# Patient Record
Sex: Female | Born: 1950 | Race: White | Hispanic: No | Smoking: Never smoker
Health system: Southern US, Community
[De-identification: ages and names within clinical notes are randomized; demographics above are authoritative.]

## PROBLEM LIST (undated history)

## (undated) DIAGNOSIS — I10 Essential (primary) hypertension: Secondary | ICD-10-CM

## (undated) DIAGNOSIS — C801 Malignant (primary) neoplasm, unspecified: Secondary | ICD-10-CM

## (undated) HISTORY — PX: WRIST SURGERY: SHX841

## (undated) HISTORY — PX: ABDOMINAL HYSTERECTOMY: SHX81

---

## 1997-12-28 ENCOUNTER — Other Ambulatory Visit: Admission: RE | Admit: 1997-12-28 | Discharge: 1997-12-28 | Payer: Self-pay | Admitting: Obstetrics and Gynecology

## 1999-01-25 ENCOUNTER — Other Ambulatory Visit: Admission: RE | Admit: 1999-01-25 | Discharge: 1999-01-25 | Payer: Self-pay | Admitting: Obstetrics and Gynecology

## 1999-12-28 ENCOUNTER — Encounter: Payer: Self-pay | Admitting: Emergency Medicine

## 1999-12-28 ENCOUNTER — Emergency Department (HOSPITAL_COMMUNITY): Admission: EM | Admit: 1999-12-28 | Discharge: 1999-12-28 | Payer: Self-pay | Admitting: Emergency Medicine

## 2000-01-10 ENCOUNTER — Encounter (INDEPENDENT_AMBULATORY_CARE_PROVIDER_SITE_OTHER): Payer: Self-pay

## 2000-01-10 ENCOUNTER — Other Ambulatory Visit: Admission: RE | Admit: 2000-01-10 | Discharge: 2000-01-10 | Payer: Self-pay | Admitting: Gastroenterology

## 2000-02-15 ENCOUNTER — Other Ambulatory Visit: Admission: RE | Admit: 2000-02-15 | Discharge: 2000-02-15 | Payer: Self-pay | Admitting: Obstetrics and Gynecology

## 2006-06-28 ENCOUNTER — Emergency Department (HOSPITAL_COMMUNITY): Admission: EM | Admit: 2006-06-28 | Discharge: 2006-06-29 | Payer: Self-pay | Admitting: Emergency Medicine

## 2008-07-17 ENCOUNTER — Emergency Department (HOSPITAL_COMMUNITY): Admission: EM | Admit: 2008-07-17 | Discharge: 2008-07-17 | Payer: Self-pay | Admitting: Emergency Medicine

## 2010-09-07 ENCOUNTER — Emergency Department (HOSPITAL_COMMUNITY): Payer: Non-veteran care

## 2010-09-07 ENCOUNTER — Emergency Department (HOSPITAL_COMMUNITY)
Admission: EM | Admit: 2010-09-07 | Discharge: 2010-09-08 | Disposition: A | Discharge status: Home / Self Care | Payer: Non-veteran care | Attending: Emergency Medicine | Admitting: Emergency Medicine

## 2010-09-07 DIAGNOSIS — R197 Diarrhea, unspecified: Secondary | ICD-10-CM | POA: Insufficient documentation

## 2010-09-07 DIAGNOSIS — E876 Hypokalemia: Secondary | ICD-10-CM | POA: Insufficient documentation

## 2010-09-07 DIAGNOSIS — R112 Nausea with vomiting, unspecified: Secondary | ICD-10-CM | POA: Insufficient documentation

## 2010-09-07 DIAGNOSIS — R109 Unspecified abdominal pain: Secondary | ICD-10-CM | POA: Insufficient documentation

## 2010-09-07 LAB — COMPREHENSIVE METABOLIC PANEL
ALT: 54 U/L — ABNORMAL HIGH (ref 0–35)
AST: 34 U/L (ref 0–37)
Alkaline Phosphatase: 133 U/L — ABNORMAL HIGH (ref 39–117)
CO2: 25 mEq/L (ref 19–32)
GFR calc non Af Amer: 47 mL/min — ABNORMAL LOW (ref >60)
Glucose, Bld: 104 mg/dL — ABNORMAL HIGH (ref 70–99)
Potassium: 2.3 mEq/L — CL (ref 3.5–5.1)
Sodium: 136 mEq/L (ref 135–145)

## 2010-09-07 LAB — CBC
HCT: 38.4 % (ref 36.0–46.0)
MCH: 29.1 pg (ref 26.0–34.0)
MCHC: 35.4 g/dL (ref 30.0–36.0)
MCV: 82.2 fL (ref 78.0–100.0)
RDW: 13.7 % (ref 11.5–15.5)

## 2010-09-07 LAB — DIFFERENTIAL
Basophils Absolute: 0 10*3/uL (ref 0.0–0.1)
Eosinophils Relative: 4 % (ref 0–5)
Lymphocytes Relative: 18 % (ref 12–46)
Monocytes Absolute: 0.9 10*3/uL (ref 0.1–1.0)

## 2010-09-07 LAB — LIPASE, BLOOD: Lipase: 14 U/L (ref 11–59)

## 2010-09-07 LAB — URINALYSIS, ROUTINE W REFLEX MICROSCOPIC
Bilirubin Urine: NEGATIVE
Hgb urine dipstick: NEGATIVE
Ketones, ur: NEGATIVE mg/dL
Protein, ur: NEGATIVE mg/dL
Urobilinogen, UA: 0.2 mg/dL (ref 0.0–1.0)

## 2010-09-08 LAB — BASIC METABOLIC PANEL
BUN: 20 mg/dL (ref 6–23)
CO2: 27 mEq/L (ref 19–32)
Calcium: 9.2 mg/dL (ref 8.4–10.5)
GFR calc non Af Amer: >60 mL/min (ref >60)
Glucose, Bld: 97 mg/dL (ref 70–99)
Potassium: 2.7 mEq/L — CL (ref 3.5–5.1)

## 2014-05-14 ENCOUNTER — Emergency Department (HOSPITAL_COMMUNITY)
Admission: EM | Admit: 2014-05-14 | Discharge: 2014-05-14 | Disposition: A | Discharge status: Home / Self Care | Payer: Non-veteran care | Attending: Emergency Medicine | Admitting: Emergency Medicine

## 2014-05-14 ENCOUNTER — Encounter (HOSPITAL_COMMUNITY): Payer: Self-pay | Admitting: Emergency Medicine

## 2014-05-14 ENCOUNTER — Emergency Department (HOSPITAL_COMMUNITY): Payer: Non-veteran care

## 2014-05-14 DIAGNOSIS — I10 Essential (primary) hypertension: Secondary | ICD-10-CM | POA: Insufficient documentation

## 2014-05-14 DIAGNOSIS — R110 Nausea: Secondary | ICD-10-CM | POA: Diagnosis not present

## 2014-05-14 DIAGNOSIS — R079 Chest pain, unspecified: Secondary | ICD-10-CM | POA: Diagnosis present

## 2014-05-14 DIAGNOSIS — Z79899 Other long term (current) drug therapy: Secondary | ICD-10-CM | POA: Insufficient documentation

## 2014-05-14 HISTORY — DX: Essential (primary) hypertension: I10

## 2014-05-14 LAB — BASIC METABOLIC PANEL
Anion gap: 6 (ref 5–15)
BUN: 18 mg/dL (ref 6–23)
CALCIUM: 10 mg/dL (ref 8.4–10.5)
CHLORIDE: 107 meq/L (ref 96–112)
CO2: 30 mmol/L (ref 19–32)
Creatinine, Ser: 0.99 mg/dL (ref 0.50–1.10)
GFR calc non Af Amer: 59 mL/min — ABNORMAL LOW (ref >90)
GFR, EST AFRICAN AMERICAN: 68 mL/min — AB (ref >90)
Glucose, Bld: 90 mg/dL (ref 70–99)
POTASSIUM: 3.2 mmol/L — AB (ref 3.5–5.1)
Sodium: 143 mmol/L (ref 135–145)

## 2014-05-14 LAB — CBC WITH DIFFERENTIAL/PLATELET
Basophils Absolute: 0 10*3/uL (ref 0.0–0.1)
Basophils Relative: 0 % (ref 0–1)
EOS ABS: 0.2 10*3/uL (ref 0.0–0.7)
EOS PCT: 2 % (ref 0–5)
HCT: 39.2 % (ref 36.0–46.0)
Hemoglobin: 13.6 g/dL (ref 12.0–15.0)
LYMPHS ABS: 2.4 10*3/uL (ref 0.7–4.0)
LYMPHS PCT: 26 % (ref 12–46)
MCH: 28.4 pg (ref 26.0–34.0)
MCHC: 34.7 g/dL (ref 30.0–36.0)
MCV: 81.8 fL (ref 78.0–100.0)
MONO ABS: 0.6 10*3/uL (ref 0.1–1.0)
Monocytes Relative: 7 % (ref 3–12)
Neutro Abs: 6 10*3/uL (ref 1.7–7.7)
Neutrophils Relative %: 65 % (ref 43–77)
PLATELETS: 260 10*3/uL (ref 150–400)
RBC: 4.79 MIL/uL (ref 3.87–5.11)
RDW: 14 % (ref 11.5–15.5)
WBC: 9.2 10*3/uL (ref 4.0–10.5)

## 2014-05-14 LAB — D-DIMER, QUANTITATIVE (NOT AT ARMC): D DIMER QUANT: 0.31 ug{FEU}/mL (ref 0.00–0.48)

## 2014-05-14 LAB — I-STAT TROPONIN, ED
TROPONIN I, POC: 0 ng/mL (ref 0.00–0.08)
TROPONIN I, POC: 0.01 ng/mL (ref 0.00–0.08)

## 2014-05-14 NOTE — ED Provider Notes (Signed)
CSN: [id number]     Arrival date & time 05/14/14  1238 History   First MD Initiated Contact with Patient 05/14/14 1636     Chief Complaint  Patient presents with  . Chest Pain     (Consider location/radiation/quality/duration/timing/severity/associated sxs/prior Treatment) Patient is a 64 y.o. female presenting with chest pain.  Chest Pain Pain location:  Substernal area Pain quality: pressure   Pain radiates to:  Does not radiate Pain radiates to the back: no   Pain severity:  Moderate Onset quality:  Gradual Duration:  1 day Timing:  Intermittent Progression:  Waxing and waning Chronicity:  New Context comment:  Changed BP medications to AM instead of PM Relieved by:  Nothing Worsened by:  Nothing tried Associated symptoms: nausea   Associated symptoms: no abdominal pain, no cough, no diaphoresis, no fever, no shortness of breath, no syncope and not vomiting     Past Medical History  Diagnosis Date  . Hypertension    History reviewed. No pertinent past surgical history. History reviewed. No pertinent family history. History  Substance Use Topics  . Smoking status: Never Smoker   . Smokeless tobacco: Not on file  . Alcohol Use: No   OB History    No data available     Review of Systems  Constitutional: Negative for fever and diaphoresis.  Respiratory: Negative for cough and shortness of breath.   Cardiovascular: Positive for chest pain. Negative for syncope.  Gastrointestinal: Positive for nausea. Negative for vomiting and abdominal pain.  All other systems reviewed and are negative.     Allergies  Review of patient's allergies indicates no known allergies.  Home Medications   Prior to Admission medications   Medication Sig Start Date End Date Taking? Authorizing Provider  amLODipine (NORVASC) 5 MG tablet Take 5 mg by mouth daily.   Yes Historical Provider, MD  calcium-vitamin D (OSCAL WITH D) 500-200 MG-UNIT per tablet Take 1 tablet by mouth daily with  breakfast.   Yes Historical Provider, MD  Cyanocobalamin (VITAMIN B 12 PO) Take 1,000 mcg by mouth daily.   Yes Historical Provider, MD  Magnesium Oxide (MAG-OX 400 PO) Take 400 mg by mouth daily.   Yes Historical Provider, MD  Multiple Vitamins-Minerals (MULTIVITAMIN WITH MINERALS) tablet Take 1 tablet by mouth daily.   Yes Historical Provider, MD  omeprazole (PRILOSEC) 20 MG capsule Take 20 mg by mouth 2 (two) times daily before a meal.   Yes Historical Provider, MD  triamterene-hydrochlorothiazide (MAXZIDE-25) 37.5-25 MG per tablet Take 0.5 tablets by mouth daily.   Yes Historical Provider, MD  venlafaxine (EFFEXOR) 75 MG tablet Take 75 mg by mouth daily.   Yes Historical Provider, MD  Vitamin D, Cholecalciferol, 1000 UNITS TABS Take 2,000 Units by mouth 2 (two) times daily.   Yes Historical Provider, MD   BP 172/58 mmHg  Pulse 60  Temp(Src) 98.3 F (36.8 C) (Oral)  Resp 14  SpO2 100% Physical Exam  Constitutional: She is oriented to person, place, and time. She appears well-developed and well-nourished.  HENT:  Head: Normocephalic and atraumatic.  Right Ear: External ear normal.  Left Ear: External ear normal.  Eyes: Conjunctivae and EOM are normal. Pupils are equal, round, and reactive to light.  Neck: Normal range of motion. Neck supple.  Cardiovascular: Normal rate, regular rhythm, normal heart sounds and intact distal pulses.   Pulmonary/Chest: Effort normal and breath sounds normal.  Abdominal: Soft. Bowel sounds are normal. There is no tenderness.  Musculoskeletal: Normal range of  motion.       Right lower leg: Normal.       Left lower leg: She exhibits swelling and edema.  Neurological: She is alert and oriented to person, place, and time.  Skin: Skin is warm and dry.  Vitals reviewed.   ED Course  Procedures (including critical care time) Labs Review Labs Reviewed  BASIC METABOLIC PANEL - Abnormal; Notable for the following:    Potassium 3.2 (*)    GFR calc non Af  Amer 59 (*)    GFR calc Af Amer 68 (*)    All other components within normal limits  CBC WITH DIFFERENTIAL  D-DIMER, QUANTITATIVE  I-STAT TROPOININ, ED  Randolm Idol, ED    Imaging Review Dg Chest 2 View  05/14/2014   CLINICAL DATA:  Chest pain.  EXAM: CHEST  2 VIEW  COMPARISON:  Chest CT 06/29/2006 and chest radiographs 06/28/2006  FINDINGS: Cardiomediastinal silhouette is within normal limits. The patient has taken a greater inspiration than on the prior study with improved aeration of the lung bases. There is mild biapical scarring. There is no evidence of acute airspace consolidation, edema, pleural effusion, or pneumothorax. No acute osseous abnormality is identified.  IMPRESSION: No active cardiopulmonary disease.   Electronically Signed   By: Logan Bores   On: 05/14/2014 14:24     EKG Interpretation   Date/Time:  Thursday May 14 2014 12:43:41 EST Ventricular Rate:  79 PR Interval:  148 QRS Duration: 74 QT Interval:  392 QTC Calculation: 449 R Axis:   -11 Text Interpretation:  Normal sinus rhythm Left ventricular hypertrophy  with repolarization abnormality Abnormal ECG No significant change since  last tracing Confirmed by Debby Freiberg [phone]) on 05/14/2014 4:39:37 PM      MDM   Final diagnoses:  Chest pain, unspecified chest pain type    64 y.o. female with pertinent PMH of HTN presents with chest pain as above.  Pain resolved prior to my exam, pt states it only occurs briefly and self resolves prior to being able to elicit symptoms.  Exam as above.  Wu unremarkable including delta troponin.  No syncope.  Likely medication reaction.  Pt asymptomatic throughout my care.standard return precautions given.  I have reviewed all laboratory and imaging studies if ordered as above  1. Chest pain, unspecified chest pain type   2. Chest pain         Debby Freiberg, MD 05/14/14 [phone]

## 2014-05-14 NOTE — Discharge Instructions (Signed)

## 2014-05-14 NOTE — ED Notes (Signed)
Pt c/o mid sternal CP and htn starting this am; pt sts just changed her BP meds

## 2015-12-19 ENCOUNTER — Emergency Department (HOSPITAL_COMMUNITY): Payer: Non-veteran care

## 2015-12-19 ENCOUNTER — Emergency Department (HOSPITAL_COMMUNITY)
Admission: EM | Admit: 2015-12-19 | Discharge: 2015-12-19 | Disposition: A | Discharge status: Home / Self Care | Payer: Non-veteran care | Attending: Emergency Medicine | Admitting: Emergency Medicine

## 2015-12-19 ENCOUNTER — Encounter (HOSPITAL_COMMUNITY): Payer: Self-pay | Admitting: Emergency Medicine

## 2015-12-19 DIAGNOSIS — Z7982 Long term (current) use of aspirin: Secondary | ICD-10-CM | POA: Diagnosis not present

## 2015-12-19 DIAGNOSIS — Z79899 Other long term (current) drug therapy: Secondary | ICD-10-CM | POA: Insufficient documentation

## 2015-12-19 DIAGNOSIS — R42 Dizziness and giddiness: Secondary | ICD-10-CM | POA: Insufficient documentation

## 2015-12-19 DIAGNOSIS — I10 Essential (primary) hypertension: Secondary | ICD-10-CM | POA: Insufficient documentation

## 2015-12-19 HISTORY — DX: Malignant (primary) neoplasm, unspecified: C80.1

## 2015-12-19 LAB — BASIC METABOLIC PANEL
Anion gap: 10 (ref 5–15)
BUN: 15 mg/dL (ref 6–20)
CO2: 23 mmol/L (ref 22–32)
Calcium: 9.9 mg/dL (ref 8.9–10.3)
Chloride: 107 mmol/L (ref 101–111)
Creatinine, Ser: 1.01 mg/dL — ABNORMAL HIGH (ref 0.44–1.00)
GFR calc Af Amer: >60 mL/min (ref >60)
GFR calc non Af Amer: 56 mL/min — ABNORMAL LOW (ref >60)
Glucose, Bld: 125 mg/dL — ABNORMAL HIGH (ref 65–99)
Potassium: 2.9 mmol/L — ABNORMAL LOW (ref 3.5–5.1)
Sodium: 140 mmol/L (ref 135–145)

## 2015-12-19 LAB — CBC
HCT: 41.4 % (ref 36.0–46.0)
Hemoglobin: 14 g/dL (ref 12.0–15.0)
MCH: 28.7 pg (ref 26.0–34.0)
MCHC: 33.8 g/dL (ref 30.0–36.0)
MCV: 85 fL (ref 78.0–100.0)
Platelets: 243 10*3/uL (ref 150–400)
RBC: 4.87 MIL/uL (ref 3.87–5.11)
RDW: 14.6 % (ref 11.5–15.5)
WBC: 9.1 10*3/uL (ref 4.0–10.5)

## 2015-12-19 LAB — URINALYSIS, ROUTINE W REFLEX MICROSCOPIC
Bilirubin Urine: NEGATIVE
Glucose, UA: NEGATIVE mg/dL
Hgb urine dipstick: NEGATIVE
Ketones, ur: NEGATIVE mg/dL
Nitrite: NEGATIVE
Protein, ur: NEGATIVE mg/dL
Specific Gravity, Urine: 1.018 (ref 1.005–1.030)
pH: 7 (ref 5.0–8.0)

## 2015-12-19 LAB — URINE MICROSCOPIC-ADD ON

## 2015-12-19 MED ORDER — LORAZEPAM 2 MG/ML IJ SOLN
1.0000 mg | Freq: Once | INTRAMUSCULAR | Status: AC
  Administered 2015-12-19: 1 mg via INTRAVENOUS
  Filled 2015-12-19: qty 1

## 2015-12-19 MED ORDER — POTASSIUM CHLORIDE CRYS ER 20 MEQ PO TBCR
60.0000 meq | EXTENDED_RELEASE_TABLET | Freq: Once | ORAL | Status: AC
  Administered 2015-12-19: 60 meq via ORAL
  Filled 2015-12-19: qty 3

## 2015-12-19 MED ORDER — MECLIZINE HCL 25 MG PO TABS
25.0000 mg | ORAL_TABLET | Freq: Once | ORAL | Status: AC
  Administered 2015-12-19: 25 mg via ORAL
  Filled 2015-12-19: qty 1

## 2015-12-19 MED ORDER — DIAZEPAM 5 MG/ML IJ SOLN
5.0000 mg | Freq: Once | INTRAMUSCULAR | Status: AC
Start: 2015-12-19 — End: 2015-12-19
  Administered 2015-12-19: 5 mg via INTRAVENOUS
  Filled 2015-12-19: qty 2

## 2015-12-19 MED ORDER — MECLIZINE HCL 25 MG PO TABS
25.0000 mg | ORAL_TABLET | Freq: Three times a day (TID) | ORAL | 0 refills | Status: AC | PRN
Start: 2015-12-19 — End: ?

## 2015-12-19 NOTE — ED Notes (Signed)
MRI called to report pt having panic attack and needs something for anxiety.

## 2015-12-19 NOTE — ED Notes (Signed)
Patient transported to MRI 

## 2015-12-19 NOTE — ED Provider Notes (Signed)
[facility] DEPT Provider Note   CSN: [id number] Arrival date & time: 12/19/15  1124     History   Chief Complaint Chief Complaint  Patient presents with  . Dizziness  . Fall    HPI Sydney Neal is a 65 y.o. female.  Patient presents today with a chief complaint of dizziness.  She describes the dizziness as both vertigo and also lightheadedness.  She states that it has been occurring intermittently since 2 AM this morning.  She states that at 2 AM she got out of bed to go to the bathroom.  While in the bathroom she became dizzy and unsteady causing her to fall to the ground.  She landed on her left side when she fell and did not hit her head.  No LOC.  She states that she then went back to bed. She continued to have dizziness intermittently this morning, which prompted her to come in.  She states that the dizziness is worse when going from a lying to sitting position and also when she turns her head quickly.  She denies any headache, vision changes, numbness, tingling, focal weakness, facial asymmetry, chest pain, SOB, or any other symptoms at this time.      Past Medical History:  Diagnosis Date  . Cancer ([place])   . Hypertension     There are no active problems to display for this patient.   Past Surgical History:  Procedure Laterality Date  . ABDOMINAL HYSTERECTOMY    . WRIST SURGERY      OB History    No data available       Home Medications    Prior to Admission medications   Medication Sig Start Date End Date Taking? Authorizing Provider  amLODipine (NORVASC) 10 MG tablet Take 5 mg by mouth daily.   Yes Historical Provider, MD  aspirin EC 81 MG tablet Take 81 mg by mouth daily.   Yes Historical Provider, MD  atorvastatin (LIPITOR) 40 MG tablet Take 20 mg by mouth daily.   Yes Historical Provider, MD  CALCIUM CITRATE PO Take 2 tablets by mouth 2 (two) times daily.   Yes Historical Provider, MD  Cyanocobalamin (VITAMIN B 12 PO) Take 1,000 mcg by mouth daily.    Yes Historical Provider, MD  losartan (COZAAR) 25 MG tablet Take 12.5 mg by mouth daily.   Yes Historical Provider, MD  Magnesium Oxide (MAG-OX 400 PO) Take 400 mg by mouth daily.   Yes Historical Provider, MD  Multiple Vitamins-Minerals (MULTIVITAMIN WITH MINERALS) tablet Take 1 tablet by mouth daily.   Yes Historical Provider, MD  omeprazole (PRILOSEC) 20 MG capsule Take 20 mg by mouth 2 (two) times daily before a meal.   Yes Historical Provider, MD  triamterene-hydrochlorothiazide (MAXZIDE-25) 37.5-25 MG per tablet Take 1 tablet by mouth daily.    Yes Historical Provider, MD  venlafaxine XR (EFFEXOR-XR) 75 MG 24 hr capsule Take 75 mg by mouth daily with breakfast.   Yes Historical Provider, MD  Vitamin D, Cholecalciferol, 1000 UNITS TABS Take 2,000 Units by mouth 2 (two) times daily.   Yes Historical Provider, MD    Family History No family history on file.  Social History Social History  Substance Use Topics  . Smoking status: Never Smoker  . Smokeless tobacco: Never Used  . Alcohol use No     Allergies   Review of patient's allergies indicates no known allergies.   Review of Systems Review of Systems  All other systems reviewed and are negative.  Physical Exam Updated Vital Signs BP 144/62   Pulse (!) 54   Temp 98.3 F (36.8 C)   Resp 16   Ht 5' 6.5" (1.689 m)   Wt 74.8 kg   SpO2 97%   BMI 26.23 kg/m   Physical Exam  Constitutional: She is oriented to person, place, and time. She appears well-developed and well-nourished.  HENT:  Head: Normocephalic and atraumatic.  Right Ear: Tympanic membrane and ear canal normal.  Left Ear: Tympanic membrane and ear canal normal.  Mouth/Throat: Oropharynx is clear and moist.  Eyes: EOM are normal. Pupils are equal, round, and reactive to light.  Neck: Normal range of motion. Neck supple.  Cardiovascular: Normal rate, regular rhythm and normal heart sounds.   No murmur heard. Pulmonary/Chest: Effort normal and breath  sounds normal.  Abdominal: Soft. There is no tenderness.  Musculoskeletal: Normal range of motion.  Neurological: She is alert and oriented to person, place, and time. She has normal strength. No cranial nerve deficit or sensory deficit. Coordination normal. GCS eye subscore is 4. GCS verbal subscore is 5. GCS motor subscore is 6.  Some dizziness and ataxia with ambulating Normal finger to nose testing, no dysmetria Normal heel to shin testing Normal rapid alternating movements.   Skin: Skin is warm and dry.  Psychiatric: She has a normal mood and affect.  Nursing note and vitals reviewed.    ED Treatments / Results  Labs (all labs ordered are listed, but only abnormal results are displayed) Labs Reviewed  BASIC METABOLIC PANEL - Abnormal; Notable for the following:       Result Value   Potassium 2.9 (*)    Glucose, Bld 125 (*)    Creatinine, Ser 1.01 (*)    GFR calc non Af Amer 56 (*)    All other components within normal limits  URINALYSIS, ROUTINE W REFLEX MICROSCOPIC (NOT AT [facility]) - Abnormal; Notable for the following:    Leukocytes, UA SMALL (*)    All other components within normal limits  URINE MICROSCOPIC-ADD ON - Abnormal; Notable for the following:    Squamous Epithelial / LPF 0-5 (*)    Bacteria, UA RARE (*)    All other components within normal limits  CBC  CBG MONITORING, ED    EKG  EKG Interpretation  Date/Time:  Sunday December 19 2015 11:31:34 EDT Ventricular Rate:  65 PR Interval:  150 QRS Duration: 80 QT Interval:  358 QTC Calculation: 372 R Axis:   -20 Text Interpretation:  Normal sinus rhythm Left ventricular hypertrophy Cannot rule out Septal infarct , age undetermined Abnormal ECG No significant change since last tracing Confirmed by Wilson Singer  MD, STEPHEN [id number]) on 12/19/2015 12:45:33 PM       Radiology No results found.  Procedures Procedures (including critical care time)  Medications Ordered in ED Medications  potassium chloride SA  (K-DUR,KLOR-CON) CR tablet 60 mEq (not administered)  meclizine (ANTIVERT) tablet 25 mg (25 mg Oral Given 12/19/15 1346)     Initial Impression / Assessment and Plan / ED Course  I have reviewed the triage vital signs and the nursing notes.  Pertinent labs & imaging results that were available during my care of the patient were reviewed by me and considered in my medical decision making (see chart for details).  Clinical Course    6:11 PM Ambulated patient.  No ataxia. She states that the dizziness has resolved at this time.  Final Clinical Impressions(s) / ED Diagnoses   Final diagnoses:  None   Patient presents today with a chief complaint of dizziness onset this morning.  On exam, she has a normal neurological exam aside from the dizziness and ataxia with ambulating.  Patient initially given Meclizine with some improvement, but still have persistent dizziness with ambulation.  MRI brain then ordered to rule out a posterior stroke, which was negative.  Patient then given another dose of Meclizine and Valium in the ED and symptoms significantly improved.  She was then able to ambulate without dizziness prior to discharge.  Feel that she is stable for discharge.  Return precautions given.  Patient discussed with Dr Wilson Singer who is in agreement with the plan.   New Prescriptions New Prescriptions   No medications on file     Hyman Bible, PA-C 12/23/15 [place], MD 01/11/16 1702

## 2015-12-19 NOTE — ED Triage Notes (Signed)
Pt c/o dizziness with fall onset about 0200 today. Pt reports that she fell in the bathroom. Pt denies + LOC.

## 2017-01-20 ENCOUNTER — Emergency Department (HOSPITAL_COMMUNITY)
Admission: EM | Admit: 2017-01-20 | Discharge: 2017-01-20 | Disposition: A | Discharge status: Home / Self Care | Payer: Non-veteran care | Attending: Emergency Medicine | Admitting: Emergency Medicine

## 2017-01-20 ENCOUNTER — Emergency Department (HOSPITAL_COMMUNITY): Payer: Non-veteran care

## 2017-01-20 ENCOUNTER — Encounter (HOSPITAL_COMMUNITY): Payer: Self-pay | Admitting: Emergency Medicine

## 2017-01-20 DIAGNOSIS — R319 Hematuria, unspecified: Secondary | ICD-10-CM

## 2017-01-20 DIAGNOSIS — Z8541 Personal history of malignant neoplasm of cervix uteri: Secondary | ICD-10-CM | POA: Insufficient documentation

## 2017-01-20 DIAGNOSIS — N3091 Cystitis, unspecified with hematuria: Secondary | ICD-10-CM | POA: Diagnosis not present

## 2017-01-20 DIAGNOSIS — I10 Essential (primary) hypertension: Secondary | ICD-10-CM | POA: Insufficient documentation

## 2017-01-20 DIAGNOSIS — N3090 Cystitis, unspecified without hematuria: Secondary | ICD-10-CM

## 2017-01-20 DIAGNOSIS — Z7982 Long term (current) use of aspirin: Secondary | ICD-10-CM | POA: Diagnosis not present

## 2017-01-20 DIAGNOSIS — Z79899 Other long term (current) drug therapy: Secondary | ICD-10-CM | POA: Insufficient documentation

## 2017-01-20 DIAGNOSIS — R300 Dysuria: Secondary | ICD-10-CM | POA: Diagnosis present

## 2017-01-20 LAB — URINALYSIS, MICROSCOPIC (REFLEX)
BACTERIA UA: NONE SEEN
SQUAMOUS EPITHELIAL / LPF: NONE SEEN

## 2017-01-20 LAB — CBC WITH DIFFERENTIAL/PLATELET
BASOS ABS: 0 10*3/uL (ref 0.0–0.1)
Basophils Relative: 0 %
EOS ABS: 0.2 10*3/uL (ref 0.0–0.7)
EOS PCT: 1 %
HCT: 35.5 % — ABNORMAL LOW (ref 36.0–46.0)
Hemoglobin: 11.9 g/dL — ABNORMAL LOW (ref 12.0–15.0)
LYMPHS ABS: 1.7 10*3/uL (ref 0.7–4.0)
Lymphocytes Relative: 13 %
MCH: 27.9 pg (ref 26.0–34.0)
MCHC: 33.5 g/dL (ref 30.0–36.0)
MCV: 83.1 fL (ref 78.0–100.0)
MONO ABS: 0.7 10*3/uL (ref 0.1–1.0)
Monocytes Relative: 5 %
Neutro Abs: 10.5 10*3/uL — ABNORMAL HIGH (ref 1.7–7.7)
Neutrophils Relative %: 81 %
PLATELETS: 199 10*3/uL (ref 150–400)
RBC: 4.27 MIL/uL (ref 3.87–5.11)
RDW: 14.6 % (ref 11.5–15.5)
WBC: 13 10*3/uL — AB (ref 4.0–10.5)

## 2017-01-20 LAB — I-STAT CHEM 8, ED
BUN: 13 mg/dL (ref 6–20)
CALCIUM ION: 1.18 mmol/L (ref 1.15–1.40)
CHLORIDE: 107 mmol/L (ref 101–111)
Creatinine, Ser: 0.9 mg/dL (ref 0.44–1.00)
GLUCOSE: 78 mg/dL (ref 65–99)
HCT: 34 % — ABNORMAL LOW (ref 36.0–46.0)
HEMOGLOBIN: 11.6 g/dL — AB (ref 12.0–15.0)
Potassium: 4 mmol/L (ref 3.5–5.1)
SODIUM: 143 mmol/L (ref 135–145)
TCO2: 26 mmol/L (ref 22–32)

## 2017-01-20 LAB — URINALYSIS, ROUTINE W REFLEX MICROSCOPIC
BILIRUBIN URINE: NEGATIVE
GLUCOSE, UA: NEGATIVE mg/dL
Ketones, ur: 15 mg/dL — AB
Nitrite: POSITIVE — AB
PROTEIN: 100 mg/dL — AB
Specific Gravity, Urine: 1.02 (ref 1.005–1.030)
pH: 7.5 (ref 5.0–8.0)

## 2017-01-20 MED ORDER — ACETAMINOPHEN 325 MG PO TABS
650.0000 mg | ORAL_TABLET | Freq: Once | ORAL | Status: AC
  Administered 2017-01-20: 650 mg via ORAL
  Filled 2017-01-20: qty 2

## 2017-01-20 MED ORDER — PHENAZOPYRIDINE HCL 95 MG PO TABS: 95.0000 mg | ORAL_TABLET | Freq: Three times a day (TID) | ORAL | 0 refills | Status: AC | PRN

## 2017-01-20 NOTE — ED Notes (Signed)
MD at bedside. 

## 2017-01-20 NOTE — ED Triage Notes (Signed)
Pt having urinary frequency started last night with low abd pain, dysuria, states blood in commode after every urination.

## 2017-01-20 NOTE — ED Provider Notes (Signed)
[facility] DEPT Provider Note   CSN: [id number] Arrival date & time: 01/20/17  2355     History   Chief Complaint Chief Complaint  Patient presents with  . Hematuria    HPI Sydney Neal is a 66 y.o. female.  HPI Patient presents with dysuria and lower abdominal pain and hematuria. States her is red blood and some blood clots in the toilet every time she urinates his had to urinate freely. Began last night. No other bleeding. No vaginal bleeding or discharge and has had a previous hysterectomy for uterine cancer. Reportedly had not spread. No fevers or chills. No flank pain. Past Medical History:  Diagnosis Date  . Cancer [facility])    hysterectomy  . Hypertension     There are no active problems to display for this patient.   Past Surgical History:  Procedure Laterality Date  . ABDOMINAL HYSTERECTOMY    . WRIST SURGERY      OB History    No data available       Home Medications    Prior to Admission medications   Medication Sig Start Date End Date Taking? Authorizing Provider  amLODipine (NORVASC) 10 MG tablet Take 10 mg by mouth daily.    Yes [provider]  aspirin EC 81 MG tablet Take 81 mg by mouth daily.   Yes [provider]  atorvastatin (LIPITOR) 40 MG tablet Take 20 mg by mouth at bedtime.    Yes [provider]  CALCIUM CITRATE PO Take 2 tablets by mouth 2 (two) times daily.   Yes [provider]  Magnesium Oxide (MAG-OX 400 PO) Take 400 mg by mouth daily.   Yes [provider]  meclizine (ANTIVERT) 25 MG tablet Take 1 tablet (25 mg total) by mouth 3 (three) times daily as needed for dizziness. 12/19/15  Yes Hyman Bible, PA-C  Multiple Vitamins-Minerals (MULTIVITAMIN WITH MINERALS) tablet Take 1 tablet by mouth daily.   Yes [provider]  omeprazole (PRILOSEC) 20 MG capsule Take 20 mg by mouth 2 (two) times daily before a meal.   Yes [provider]  OVER THE COUNTER MEDICATION Take 1  tablet by mouth daily. Per pts, she took the combination pill of 3 different vitamins.   Yes [provider]  potassium chloride SA (K-DUR,KLOR-CON) 20 MEQ tablet Take 10 mEq by mouth 2 (two) times daily.   Yes [provider]  triamterene-hydrochlorothiazide (MAXZIDE-25) 37.5-25 MG per tablet Take 1 tablet by mouth daily.    Yes [provider]  Vitamin D, Cholecalciferol, 1000 UNITS TABS Take 2,000 Units by mouth 2 (two) times daily.   Yes [provider]  vitamin E 400 UNIT capsule Take 400 Units by mouth 2 (two) times daily.   Yes [provider]  phenazopyridine (PYRIDIUM) 95 MG tablet Take 1 tablet (95 mg total) by mouth 3 (three) times daily as needed for pain. 01/20/17   Davonna Belling, MD    Family History No family history on file.  Social History Social History  Substance Use Topics  . Smoking status: Never Smoker  . Smokeless tobacco: Never Used  . Alcohol use No     Allergies   Sulfa antibiotics   Review of Systems Review of Systems  Constitutional: Negative for appetite change.  HENT: Negative for congestion.   Respiratory: Negative for chest tightness.   Cardiovascular: Negative for chest pain.  Gastrointestinal: Negative for abdominal pain.  Genitourinary: Positive for dysuria, frequency and hematuria. Negative for dyspareunia  and vaginal bleeding.  Musculoskeletal: Negative for back pain.  Skin: Negative for rash.  Neurological: Negative for weakness and light-headedness.  Psychiatric/Behavioral: Negative for confusion.     Physical Exam Updated Vital Signs BP (!) 159/63   Pulse 69   Temp 98.9 F (37.2 C) (Oral)   Resp 20   Ht 5\' 6"  (1.676 m)   Wt 73 kg (161 lb)   SpO2 98%   BMI 25.99 kg/m   Physical Exam  Constitutional: She appears well-developed.  Neck: Neck supple.  Cardiovascular: Normal rate.   Pulmonary/Chest: Effort normal.  Abdominal:  Mild suprapubic tenderness without rebound or  guarding.  Genitourinary:  Genitourinary Comments: No CVA tenderness  Musculoskeletal: She exhibits no edema.  Neurological: She is alert.  Skin: Skin is warm. Capillary refill takes less than 2 seconds.  Psychiatric: She has a normal mood and affect.     ED Treatments / Results  Labs (all labs ordered are listed, but only abnormal results are displayed) Labs Reviewed  URINALYSIS, ROUTINE W REFLEX MICROSCOPIC - Abnormal; Notable for the following:       Result Value   Color, Urine RED (*)    APPearance TURBID (*)    Hgb urine dipstick LARGE (*)    Ketones, ur 15 (*)    Protein, ur 100 (*)    Nitrite POSITIVE (*)    Leukocytes, UA SMALL (*)    All other components within normal limits  CBC WITH DIFFERENTIAL/PLATELET - Abnormal; Notable for the following:    WBC 13.0 (*)    Hemoglobin 11.9 (*)    HCT 35.5 (*)    Neutro Abs 10.5 (*)    All other components within normal limits  I-STAT CHEM 8, ED - Abnormal; Notable for the following:    Hemoglobin 11.6 (*)    HCT 34.0 (*)    All other components within normal limits  URINE CULTURE  URINALYSIS, MICROSCOPIC (REFLEX)    EKG  EKG Interpretation None       Radiology Ct Renal Stone Study  Result Date: 01/20/2017 CLINICAL DATA:  Left-sided flank pain and history of renal calculi EXAM: CT ABDOMEN AND PELVIS WITHOUT CONTRAST TECHNIQUE: Multidetector CT imaging of the abdomen and pelvis was performed following the standard protocol without IV contrast. COMPARISON:  None. FINDINGS: Lower chest: Lung bases are free of acute infiltrate or sizable effusion. Hepatobiliary: No focal liver abnormality is seen. No gallstones, gallbladder wall thickening, or biliary dilatation. Pancreas: Unremarkable. No pancreatic ductal dilatation or surrounding inflammatory changes. Spleen: Normal in size without focal abnormality. Adrenals/Urinary Tract: The adrenal glands are within normal limits. The kidneys show no renal calculi. Mild fullness of the  proximal ureters is noted bilaterally although normal tapering is seen distally. The bladder is well distended. Stomach/Bowel: Minimal diverticular changes noted. No findings of diverticulitis are seen. No obstructive or inflammatory changes are noted. The appendix is not well seen. No inflammatory changes are noted. Vascular/Lymphatic: Aortic atherosclerosis. No enlarged abdominal or pelvic lymph nodes. Reproductive: Status post hysterectomy. No adnexal masses. Other: No abdominal wall hernia or abnormality. No abdominopelvic ascites. Musculoskeletal: Degenerative changes of the lumbar spine are noted. IMPRESSION: No renal calculi or obstructive changes are seen. Diverticulosis without diverticulitis. No acute abnormality is seen. Electronically Signed   By: Inez Catalina M.D.   On: 01/20/2017 14:01    Procedures Procedures (including critical care time)  Medications Ordered in ED Medications  acetaminophen (TYLENOL) tablet 650 mg (650 mg Oral Given 01/20/17 1000)  Initial Impression / Assessment and Plan / ED Course  I have reviewed the triage vital signs and the nursing notes.  Pertinent labs & imaging results that were available during my care of the patient were reviewed by me and considered in my medical decision making (see chart for details).     Patient presents with dysuria and suprapubic pain. Urinalysis shows red cells without white cells or bacteria. However does have some leukocyte Estrace and nitrite. Discussed with patient and we'll not empirically treat for infection but cultures been sent. Will return sooner if develops fevers chills. Will follow-up with urology. If culture comes back positive will be notified and given a prescription for antibiotics.  Final Clinical Impressions(s) / ED Diagnoses   Final diagnoses:  Hematuria, unspecified type  Cystitis    New Prescriptions New Prescriptions   PHENAZOPYRIDINE (PYRIDIUM) 95 MG TABLET    Take 1 tablet (95 mg total) by  mouth 3 (three) times daily as needed for pain.     Davonna Belling, MD 01/20/17 [phone]

## 2017-01-20 NOTE — ED Notes (Signed)
Pt in CT.

## 2017-01-20 NOTE — Discharge Instructions (Signed)
Follow-up with [facility] or [facility]. Return if you develop fevers or chills. He should be notified in the next day or 2 if the culture shows an infection.

## 2017-01-20 NOTE — ED Notes (Signed)
Bladder scanner volume 34 ml

## 2017-01-21 LAB — URINE CULTURE: Culture: NO GROWTH

## 2017-05-16 ENCOUNTER — Other Ambulatory Visit: Payer: Self-pay

## 2017-05-16 ENCOUNTER — Emergency Department (HOSPITAL_COMMUNITY)
Admission: EM | Admit: 2017-05-16 | Discharge: 2017-05-16 | Disposition: A | Discharge status: Home / Self Care | Payer: Non-veteran care | Attending: Emergency Medicine | Admitting: Emergency Medicine

## 2017-05-16 ENCOUNTER — Encounter (HOSPITAL_COMMUNITY): Payer: Self-pay

## 2017-05-16 DIAGNOSIS — Z79899 Other long term (current) drug therapy: Secondary | ICD-10-CM | POA: Insufficient documentation

## 2017-05-16 DIAGNOSIS — I10 Essential (primary) hypertension: Secondary | ICD-10-CM | POA: Diagnosis not present

## 2017-05-16 DIAGNOSIS — Z7982 Long term (current) use of aspirin: Secondary | ICD-10-CM | POA: Diagnosis not present

## 2017-05-16 DIAGNOSIS — N12 Tubulo-interstitial nephritis, not specified as acute or chronic: Secondary | ICD-10-CM | POA: Diagnosis not present

## 2017-05-16 DIAGNOSIS — R6883 Chills (without fever): Secondary | ICD-10-CM | POA: Diagnosis present

## 2017-05-16 LAB — URINALYSIS, MICROSCOPIC (REFLEX)

## 2017-05-16 LAB — URINALYSIS, ROUTINE W REFLEX MICROSCOPIC
Bilirubin Urine: NEGATIVE
GLUCOSE, UA: NEGATIVE mg/dL
KETONES UR: NEGATIVE mg/dL
Nitrite: POSITIVE — AB
PH: 6.5 (ref 5.0–8.0)
Protein, ur: 100 mg/dL — AB
Specific Gravity, Urine: 1.01 (ref 1.005–1.030)

## 2017-05-16 LAB — COMPREHENSIVE METABOLIC PANEL
ALBUMIN: 3.9 g/dL (ref 3.5–5.0)
ALT: 24 U/L (ref 14–54)
AST: 38 U/L (ref 15–41)
Alkaline Phosphatase: 106 U/L (ref 38–126)
Anion gap: 14 (ref 5–15)
BUN: 16 mg/dL (ref 6–20)
CHLORIDE: 103 mmol/L (ref 101–111)
CO2: 21 mmol/L — AB (ref 22–32)
Calcium: 9.3 mg/dL (ref 8.9–10.3)
Creatinine, Ser: 1.34 mg/dL — ABNORMAL HIGH (ref 0.44–1.00)
GFR calc Af Amer: 46 mL/min — ABNORMAL LOW (ref >60)
GFR calc non Af Amer: 40 mL/min — ABNORMAL LOW (ref >60)
GLUCOSE: 101 mg/dL — AB (ref 65–99)
POTASSIUM: 3.7 mmol/L (ref 3.5–5.1)
SODIUM: 138 mmol/L (ref 135–145)
Total Bilirubin: 1.6 mg/dL — ABNORMAL HIGH (ref 0.3–1.2)
Total Protein: 7.9 g/dL (ref 6.5–8.1)

## 2017-05-16 LAB — CBC
HEMATOCRIT: 40.2 % (ref 36.0–46.0)
Hemoglobin: 13.2 g/dL (ref 12.0–15.0)
MCH: 28.7 pg (ref 26.0–34.0)
MCHC: 32.8 g/dL (ref 30.0–36.0)
MCV: 87.4 fL (ref 78.0–100.0)
Platelets: 192 10*3/uL (ref 150–400)
RBC: 4.6 MIL/uL (ref 3.87–5.11)
RDW: 14.8 % (ref 11.5–15.5)
WBC: 14.2 10*3/uL — AB (ref 4.0–10.5)

## 2017-05-16 LAB — I-STAT CG4 LACTIC ACID, ED
Lactic Acid, Venous: 1.48 mmol/L (ref 0.5–1.9)
Lactic Acid, Venous: 0.61 mmol/L (ref 0.5–1.9)

## 2017-05-16 LAB — LIPASE, BLOOD: LIPASE: 21 U/L (ref 11–51)

## 2017-05-16 MED ORDER — ONDANSETRON 4 MG PO TBDP: 4.0000 mg | ORAL_TABLET | Freq: Three times a day (TID) | ORAL | 0 refills | Status: AC | PRN

## 2017-05-16 MED ORDER — CEPHALEXIN 500 MG PO CAPS: 500.0000 mg | ORAL_CAPSULE | Freq: Three times a day (TID) | ORAL | 0 refills | Status: AC

## 2017-05-16 MED ORDER — SODIUM CHLORIDE 0.9 % IV BOLUS (SEPSIS)
500.0000 mL | Freq: Once | INTRAVENOUS | Status: AC
  Administered 2017-05-16: 500 mL via INTRAVENOUS

## 2017-05-16 MED ORDER — DEXTROSE 5 % IV SOLN
1.0000 g | Freq: Once | INTRAVENOUS | Status: AC
  Administered 2017-05-16: 1 g via INTRAVENOUS
  Filled 2017-05-16: qty 10

## 2017-05-16 NOTE — ED Provider Notes (Signed)
[name]   CSN: [id number] Arrival date & time: 05/16/17  1517     History   Chief Complaint No chief complaint on file.   HPI Sydney Neal is a 67 y.o. female.  Patient with history of hysterectomy, urinary tract infection --presents with complaint of chills, nausea, suprapubic pain and dysuria for the past 2 days.  Patient was seen by the [place] [facility] and treated for a urinary tract infection about a month ago.  She called her primary care physician who sent in an antibiotic for her and she expects this to be delivered by mail today.  Patient did not check her temperature when she was having chills and has not documented a fever.  No vomiting.  Patient had mild diarrhea after eating fast food 2 days ago.  No blood noted in the urine.  No flank pain.  No other treatments prior to arrival.  Onset of symptoms acute.  Course is worsening.  Nothing makes symptoms better or worse.      Past Medical History:  Diagnosis Date  . Cancer [facility])    hysterectomy  . Hypertension     There are no active problems to display for this patient.   Past Surgical History:  Procedure Laterality Date  . ABDOMINAL HYSTERECTOMY    . WRIST SURGERY      OB History    No data available       Home Medications    Prior to Admission medications   Medication Sig Start Date End Date Taking? Authorizing Provider  amLODipine (NORVASC) 10 MG tablet Take 10 mg by mouth daily.     [provider]  aspirin EC 81 MG tablet Take 81 mg by mouth daily.    [provider]  atorvastatin (LIPITOR) 40 MG tablet Take 20 mg by mouth at bedtime.     [provider]  CALCIUM CITRATE PO Take 2 tablets by mouth 2 (two) times daily.    [provider]  cephALEXin (KEFLEX) 500 MG capsule Take 1 capsule (500 mg total) by mouth 3 (three) times daily. 05/16/17   Carlisle Cater, PA-C  Magnesium Oxide (MAG-OX 400 PO) Take 400 mg by mouth  daily.    [provider]  meclizine (ANTIVERT) 25 MG tablet Take 1 tablet (25 mg total) by mouth 3 (three) times daily as needed for dizziness. 12/19/15   Hyman Bible, PA-C  Multiple Vitamins-Minerals (MULTIVITAMIN WITH MINERALS) tablet Take 1 tablet by mouth daily.    [provider]  omeprazole (PRILOSEC) 20 MG capsule Take 20 mg by mouth 2 (two) times daily before a meal.    [provider]  ondansetron (ZOFRAN ODT) 4 MG disintegrating tablet Take 1 tablet (4 mg total) by mouth every 8 (eight) hours as needed for nausea or vomiting. 05/16/17   Carlisle Cater, PA-C  OVER THE COUNTER MEDICATION Take 1 tablet by mouth daily. Per pts, she took the combination pill of 3 different vitamins.    [provider]  phenazopyridine (PYRIDIUM) 95 MG tablet Take 1 tablet (95 mg total) by mouth 3 (three) times daily as needed for pain. 01/20/17   Davonna Belling, MD  potassium chloride SA (K-DUR,KLOR-CON) 20 MEQ tablet Take 10 mEq by mouth 2 (two) times daily.    [provider]  triamterene-hydrochlorothiazide (MAXZIDE-25) 37.5-25 MG per tablet Take 1 tablet by mouth daily.     [provider]  Vitamin D, Cholecalciferol, 1000 UNITS TABS Take 2,000  Units by mouth 2 (two) times daily.    [provider]  vitamin E 400 UNIT capsule Take 400 Units by mouth 2 (two) times daily.    [provider]    Family History No family history on file.  Social History Social History   Tobacco Use  . Smoking status: Never Smoker  . Smokeless tobacco: Never Used  Substance Use Topics  . Alcohol use: No  . Drug use: No     Allergies   Sulfa antibiotics   Review of Systems Review of Systems  Constitutional: Positive for chills. Negative for fever.  HENT: Negative for rhinorrhea and sore throat.   Eyes: Negative for redness.  Respiratory: Negative for cough.   Cardiovascular: Negative for chest pain.  Gastrointestinal: Positive for  abdominal pain and nausea. Negative for diarrhea and vomiting.  Genitourinary: Positive for dysuria, frequency and urgency. Negative for hematuria, vaginal bleeding and vaginal discharge.  Musculoskeletal: Negative for myalgias.  Skin: Negative for rash.  Neurological: Negative for headaches.     Physical Exam Updated Vital Signs BP (!) 113/53   Pulse 62   Temp 98.3 F (36.8 C) (Oral)   Resp 16   Ht 5\' 6"  (1.676 m)   Wt 73 kg (161 lb)   SpO2 93%   BMI 25.99 kg/m   Physical Exam  Constitutional: She appears well-developed and well-nourished.  HENT:  Head: Normocephalic and atraumatic.  Mouth/Throat: Oropharynx is clear and moist.  Eyes: Conjunctivae are normal. Right eye exhibits no discharge. Left eye exhibits no discharge.  Neck: Normal range of motion. Neck supple.  Cardiovascular: Normal rate, regular rhythm and normal heart sounds.  No murmur heard. Pulmonary/Chest: Effort normal and breath sounds normal. No stridor. No respiratory distress. She has no wheezes.  Abdominal: Soft. Bowel sounds are normal. There is tenderness in the suprapubic area. There is no rebound, no guarding, no tenderness at McBurney's point and negative Murphy's sign.  Neurological: She is alert.  Skin: Skin is warm and dry.  Psychiatric: She has a normal mood and affect.  Nursing note and vitals reviewed.    ED Treatments / Results  Labs (all labs ordered are listed, but only abnormal results are displayed) Labs Reviewed  COMPREHENSIVE METABOLIC PANEL - Abnormal; Notable for the following components:      Result Value   CO2 21 (*)    Glucose, Bld 101 (*)    Creatinine, Ser 1.34 (*)    Total Bilirubin 1.6 (*)    GFR calc non Af Amer 40 (*)    GFR calc Af Amer 46 (*)    All other components within normal limits  CBC - Abnormal; Notable for the following components:   WBC 14.2 (*)    All other components within normal limits  URINALYSIS, ROUTINE W REFLEX MICROSCOPIC - Abnormal; Notable  for the following components:   APPearance TURBID (*)    Hgb urine dipstick MODERATE (*)    Protein, ur 100 (*)    Nitrite POSITIVE (*)    Leukocytes, UA LARGE (*)    All other components within normal limits  URINALYSIS, MICROSCOPIC (REFLEX) - Abnormal; Notable for the following components:   Bacteria, UA MANY (*)    Squamous Epithelial / LPF 0-5 (*)    All other components within normal limits  URINE CULTURE  LIPASE, BLOOD  I-STAT CG4 LACTIC ACID, ED  I-STAT CG4 LACTIC ACID, ED    EKG  EKG Interpretation None  Radiology No results found.  Procedures Procedures (including critical care time)  Medications Ordered in ED Medications  cefTRIAXone (ROCEPHIN) 1 g in dextrose 5 % 50 mL IVPB (0 g Intravenous Stopped 05/16/17 1034)  sodium chloride 0.9 % bolus 500 mL (0 mLs Intravenous Stopped 05/16/17 1228)     Initial Impression / Assessment and Plan / ED Course  I have reviewed the triage vital signs and the nursing notes.  Pertinent labs & imaging results that were available during my care of the patient were reviewed by me and considered in my medical decision making (see chart for details).     Patient seen and examined.  UA shows obvious infection.  Work-up pending.  Medications ordered.   Vital signs reviewed and are as follows: BP (!) 113/53   Pulse 62   Temp 98.3 F (36.8 C) (Oral)   Resp 16   Ht 5\' 6"  (1.676 m)   Wt 73 kg (161 lb)   SpO2 93%   BMI 25.99 kg/m   Patient discussed with and seen by Dr. Ellender Hose.  Patient has signs and symptoms of UTI, possibly early pyelonephritis given nausea.  No significant flank pain or vomiting however.  Patient treated with a dose of Rocephin in the emergency department.  Urine culture sent.  Lactate is normal.  We will discharged home with Keflex times 10 days.  Strongly encouraged PCP follow-up.  Encouraged return to the emergency department with worsening abdominal pains, fevers, vomiting.  Patient verbalizes  understanding and agrees with plan.  Final Clinical Impressions(s) / ED Diagnoses   Final diagnoses:  Pyelonephritis   Patient with lower abdominal pain and nausea.  Clear UTI on UA.  White count is elevated without normal lactate.  No documented fevers here in emergency department.  No tachycardia or other signs of sepsis.  Abdomen is soft and nontender except over the suprapubic area consistent with UTI.  No rebound or guarding.  Patient is able to tolerate oral medications.  She will be discharged home with treatment for her UTI and close PCP follow-up.  Return instructions as above.  ED Discharge Orders        Ordered    cephALEXin (KEFLEX) 500 MG capsule  3 times daily     05/16/17 1217    ondansetron (ZOFRAN ODT) 4 MG disintegrating tablet  Every 8 hours PRN     05/16/17 1217       Carlisle Cater, PA-C 05/16/17 1247    Duffy Bruce, MD 05/16/17 [phone]

## 2017-05-16 NOTE — ED Notes (Signed)
Pt ambulated to the bathroom without assistance. 

## 2017-05-16 NOTE — Discharge Instructions (Signed)
Please read and follow all provided instructions.  Your diagnoses today include:  1. Pyelonephritis    Tests performed today include:  Urine test - shows urinary tract infection  Blood counts and electrolytes  Vital signs. See below for your results today.   Medications prescribed:   Keflex (cephalexin) - antibiotic  You have been prescribed an antibiotic medicine: take the entire course of medicine even if you are feeling better. Stopping early can cause the antibiotic not to work.   Zofran (ondansetron) - for nausea and vomiting  Take any prescribed medications only as directed.  Home care instructions:  Follow any educational materials contained in this packet.  Follow-up instructions: Please follow-up with your primary care provider in the next 3 days for further evaluation of your symptoms.   Return instructions:   Please return to the Emergency Department if you experience worsening symptoms.   Please return with persistent vomiting, fevers, increasing pain.  Please return if you have any other emergent concerns.  Additional Information:  Your vital signs today were: BP (!) 113/53    Pulse 62    Temp 98.3 F (36.8 C) (Oral)    Resp 16    Ht 5\' 6"  (1.676 m)    Wt 73 kg (161 lb)    SpO2 93%    BMI 25.99 kg/m  If your blood pressure (BP) was elevated above 135/85 this visit, please have this repeated by your doctor within one month. --------------

## 2017-05-16 NOTE — ED Triage Notes (Signed)
Patient reports nausea and dry heaves with chills x 2 days since eating at [employer] with mild diarrhea. Reports general abdominal pain with same and states that she has UTI and has not started antibiotics, waiting on [place]

## 2017-05-18 LAB — URINE CULTURE: Culture: >=100000 — AB

## 2017-05-19 ENCOUNTER — Telehealth: Payer: Self-pay

## 2017-05-19 NOTE — Telephone Encounter (Signed)
Post ED Visit - Positive Culture Follow-up  Culture report reviewed by antimicrobial stewardship pharmacist:  []  Elenor Quinones, Pharm.D. []  Heide Guile, Pharm.D., BCPS AQ-ID []  Parks Neptune, Pharm.D., BCPS []  Alycia Rossetti, Pharm.D., BCPS []  [place], Pharm.D., BCPS, AAHIVP []  Legrand Como, Pharm.D., BCPS, AAHIVP []  Salome Arnt, PharmD, BCPS []  Jalene Mullet, PharmD []  Vincenza Hews, PharmD, BCPS [facility] Pharm D Positive urine culture Treated with Cephalexin, organism sensitive to the same and no further patient follow-up is required at this time.  Genia Del 05/19/2017, 9:25 AM

## 2017-05-31 ENCOUNTER — Emergency Department (HOSPITAL_COMMUNITY): Payer: Non-veteran care

## 2017-05-31 ENCOUNTER — Other Ambulatory Visit: Payer: Self-pay

## 2017-05-31 ENCOUNTER — Emergency Department (HOSPITAL_COMMUNITY)
Admission: EM | Admit: 2017-05-31 | Discharge: 2017-05-31 | Disposition: A | Discharge status: Home / Self Care | Payer: Non-veteran care | Attending: Emergency Medicine | Admitting: Emergency Medicine

## 2017-05-31 DIAGNOSIS — I10 Essential (primary) hypertension: Secondary | ICD-10-CM | POA: Insufficient documentation

## 2017-05-31 DIAGNOSIS — Z79899 Other long term (current) drug therapy: Secondary | ICD-10-CM | POA: Insufficient documentation

## 2017-05-31 DIAGNOSIS — Z7982 Long term (current) use of aspirin: Secondary | ICD-10-CM | POA: Diagnosis not present

## 2017-05-31 DIAGNOSIS — R05 Cough: Secondary | ICD-10-CM | POA: Diagnosis present

## 2017-05-31 DIAGNOSIS — Z8542 Personal history of malignant neoplasm of other parts of uterus: Secondary | ICD-10-CM | POA: Insufficient documentation

## 2017-05-31 DIAGNOSIS — J069 Acute upper respiratory infection, unspecified: Secondary | ICD-10-CM | POA: Diagnosis not present

## 2017-05-31 LAB — RAPID STREP SCREEN (MED CTR MEBANE ONLY): STREPTOCOCCUS, GROUP A SCREEN (DIRECT): NEGATIVE

## 2017-05-31 MED ORDER — FLUTICASONE PROPIONATE 50 MCG/ACT NA SUSP: 1.0000 | Freq: Every day | NASAL | 0 refills | Status: AC

## 2017-05-31 NOTE — ED Notes (Signed)
ED Provider at bedside. 

## 2017-05-31 NOTE — ED Provider Notes (Signed)
[facility] EMERGENCY DEPARTMENT Provider Note   CSN: [id number] Arrival date & time: 05/31/17  7858     History   Chief Complaint Chief Complaint  Patient presents with  . Sore Throat  . Cough    productive / abt 5 days    HPI Sydney Neal is a 67 y.o. female who presents with URI symptoms.  Past medical history significant for hypertension and hyperlipidemia.  She states that she was seen in the emergency department about 2 weeks ago for a kidney infection.  She was sent home with nausea medicine and antibiotics and her symptoms have been improving from that.  When she started getting better she started to have nasal congestion, green nasal discharge, a mildly productive cough, and a sore throat. She feels short of breath and has chest pressure at times but this is with coughing.  Her symptoms have been constant for the past 5 days.  She has been taking Tylenol.  She has had trouble sleeping due to the congestion.  She denies fever, chills, body aches and she has had her flu shot this year.  She denies abdominal pain, nausea, vomiting, diarrhea, urinary symptoms.   HPI  Past Medical History:  Diagnosis Date  . Cancer [facility])    hysterectomy  . Hypertension     There are no active problems to display for this patient.   Past Surgical History:  Procedure Laterality Date  . ABDOMINAL HYSTERECTOMY    . WRIST SURGERY      OB History    No data available       Home Medications    Prior to Admission medications   Medication Sig Start Date End Date Taking? Authorizing Provider  amLODipine (NORVASC) 10 MG tablet Take 10 mg by mouth daily.     [provider]  aspirin EC 81 MG tablet Take 81 mg by mouth daily.    [provider]  atorvastatin (LIPITOR) 40 MG tablet Take 20 mg by mouth at bedtime.     [provider]  CALCIUM CITRATE PO Take 2 tablets by mouth 2 (two) times daily.    [provider]  cephALEXin (KEFLEX)  500 MG capsule Take 1 capsule (500 mg total) by mouth 3 (three) times daily. 05/16/17   Carlisle Cater, PA-C  Magnesium Oxide (MAG-OX 400 PO) Take 400 mg by mouth daily.    [provider]  meclizine (ANTIVERT) 25 MG tablet Take 1 tablet (25 mg total) by mouth 3 (three) times daily as needed for dizziness. 12/19/15   Hyman Bible, PA-C  Multiple Vitamins-Minerals (MULTIVITAMIN WITH MINERALS) tablet Take 1 tablet by mouth daily.    [provider]  omeprazole (PRILOSEC) 20 MG capsule Take 20 mg by mouth 2 (two) times daily before a meal.    [provider]  ondansetron (ZOFRAN ODT) 4 MG disintegrating tablet Take 1 tablet (4 mg total) by mouth every 8 (eight) hours as needed for nausea or vomiting. 05/16/17   Carlisle Cater, PA-C  OVER THE COUNTER MEDICATION Take 1 tablet by mouth daily. Per pts, she took the combination pill of 3 different vitamins.    [provider]  phenazopyridine (PYRIDIUM) 95 MG tablet Take 1 tablet (95 mg total) by mouth 3 (three) times daily as needed for pain. 01/20/17   Davonna Belling, MD  potassium chloride SA (K-DUR,KLOR-CON) 20 MEQ tablet Take 10 mEq by mouth 2 (two) times daily.    [provider]  triamterene-hydrochlorothiazide [facility])  37.5-25 MG per tablet Take 1 tablet by mouth daily.     [provider]  Vitamin D, Cholecalciferol, 1000 UNITS TABS Take 2,000 Units by mouth 2 (two) times daily.    [provider]  vitamin E 400 UNIT capsule Take 400 Units by mouth 2 (two) times daily.    [provider]    Family History No family history on file.  Social History Social History   Tobacco Use  . Smoking status: Never Smoker  . Smokeless tobacco: Never Used  Substance Use Topics  . Alcohol use: No  . Drug use: No     Allergies   Sulfa antibiotics   Review of Systems Review of Systems  Constitutional: Negative for appetite change, chills and fever.  HENT: Positive for  congestion, rhinorrhea and sore throat. Negative for ear pain, sinus pressure and trouble swallowing.   Respiratory: Positive for cough and shortness of breath. Negative for wheezing.   Cardiovascular: Negative for chest pain.  Gastrointestinal: Negative for abdominal pain, diarrhea, nausea and vomiting.  Genitourinary: Negative for dysuria.     Physical Exam Updated Vital Signs BP (!) 145/64 (BP Location: Right Arm)   Pulse 66   Temp 97.8 F (36.6 C) (Oral)   Resp 20   Ht 5\' 6"  (1.676 m)   Wt 73 kg (161 lb)   SpO2 96%   BMI 25.99 kg/m   Physical Exam  Constitutional: She is oriented to person, place, and time. She appears well-developed and well-nourished. No distress.  Pleasant, calm, cooperative  HENT:  Head: Normocephalic and atraumatic.  Right Ear: Hearing, tympanic membrane, external ear and ear canal normal.  Left Ear: Hearing, tympanic membrane, external ear and ear canal normal.  Nose: Nose normal.  Mouth/Throat: Uvula is midline. Posterior oropharyngeal erythema present. No oropharyngeal exudate, posterior oropharyngeal edema or tonsillar abscesses. No tonsillar exudate.  Eyes: Conjunctivae are normal. Pupils are equal, round, and reactive to light. Right eye exhibits no discharge. Left eye exhibits no discharge. No scleral icterus.  Neck: Normal range of motion.  Cardiovascular: Normal rate and regular rhythm. Exam reveals no gallop and no friction rub.  No murmur heard. Pulmonary/Chest: Effort normal and breath sounds normal. No stridor. No respiratory distress. She has no wheezes. She has no rales. She exhibits no tenderness.  Abdominal: She exhibits no distension.  Neurological: She is alert and oriented to person, place, and time.  Skin: Skin is warm and dry.  Psychiatric: She has a normal mood and affect. Her behavior is normal.  Nursing note and vitals reviewed.    ED Treatments / Results  Labs (all labs ordered are listed, but only abnormal results are  displayed) Labs Reviewed  RAPID STREP SCREEN (NOT AT [facility])  CULTURE, GROUP A STREP [facility])    EKG  EKG Interpretation None       Radiology Dg Chest 2 View  Result Date: 05/31/2017 CLINICAL DATA:  Productive cough and sore throat EXAM: CHEST  2 VIEW COMPARISON:  Chest radiograph 1146 FINDINGS: Normal mediastinum and cardiac silhouette. Normal pulmonary vasculature. No evidence of effusion, infiltrate, or pneumothorax. No acute bony abnormality. IMPRESSION: No acute cardiopulmonary process. Electronically Signed   By: Suzy Bouchard M.D.   On: 05/31/2017 11:58    Procedures Procedures (including critical care time)  Medications Ordered in ED Medications - No data to display   Initial Impression / Assessment and Plan / ED Course  I have reviewed the triage vital signs and the nursing notes.  Pertinent labs & imaging results that were available during my care of the patient were reviewed by me and considered in my medical decision making (see chart for details).  67 year old female with URI symptoms.  Likely viral.  Vital signs are normal.  Will rule out bacterial infection with a rapid strep and a chest x-ray but anticipate DC with supportive care.  Shared visit with Dr. Regenia Skeeter who is in agreement with plan.  Rapid strep and CXR are negative. She was d/c with Flonase and advised rest, fluids, supportive care. Return precautions given.  Final Clinical Impressions(s) / ED Diagnoses   Final diagnoses:  Upper respiratory tract infection, unspecified type    ED Discharge Orders    None       Recardo Evangelist, PA-C 05/31/17 1228    Sherwood Gambler, MD 06/01/17 [phone]

## 2017-05-31 NOTE — Discharge Instructions (Signed)
Please rest and drink plenty of fluids Use Flonase for nasal congestion/runny nose Continue Tylenol or Ibuprofen for pain Return if worsening

## 2017-05-31 NOTE — ED Triage Notes (Signed)
Pt presents with a sore throat and a feeling of something blocking her throat and is saying that it feels difficult to breathe. Pt states that she has a pressure on her chest and has a productive cough with green/yellow sputum.  Denies n/v/d. Denies fever.

## 2017-05-31 NOTE — ED Notes (Signed)
Pt verbalized understanding of discharge instructions and denies any further questions at this time.   

## 2017-06-02 LAB — CULTURE, GROUP A STREP (THRC)

## 2018-04-21 IMAGING — CT CT RENAL STONE PROTOCOL
2 of 4 series · 16 of 46 positions shown, 18 images · non-contrast
Comparison: None.

CLINICAL DATA: Left-sided flank pain and history of renal calculi

EXAM:
CT ABDOMEN AND PELVIS WITHOUT CONTRAST
TECHNIQUE: Multidetector CT imaging of the abdomen and pelvis was performed
following the standard protocol without IV contrast.

[Series 3: renal stone 5.0 · axial · 0.73mm/px · z∈[+984,+1399]mm · 13 of 91 slices shown, 15 images]
[im 4/91  soft-tissue]
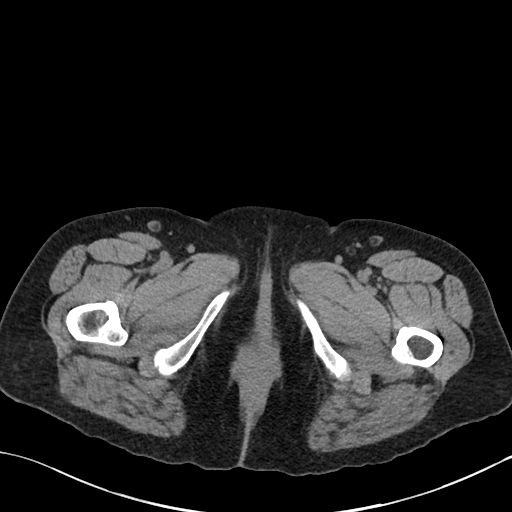
[im 4/91  bone]
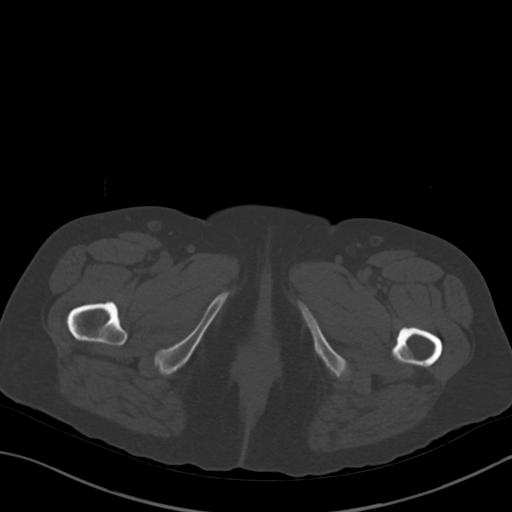
[im 12/91  soft-tissue]
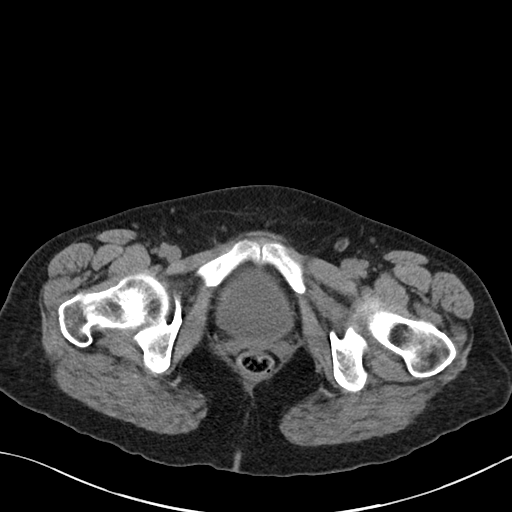
[im 19/91  soft-tissue]
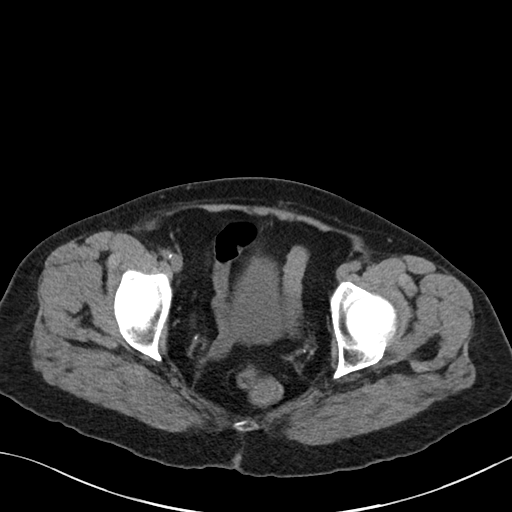
[im 27/91  soft-tissue]
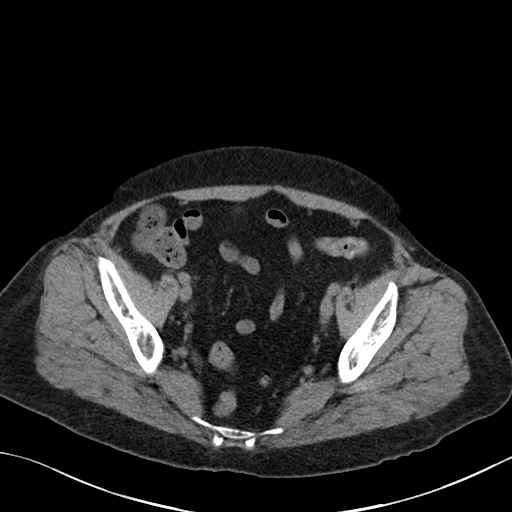
[im 31/91  soft-tissue]
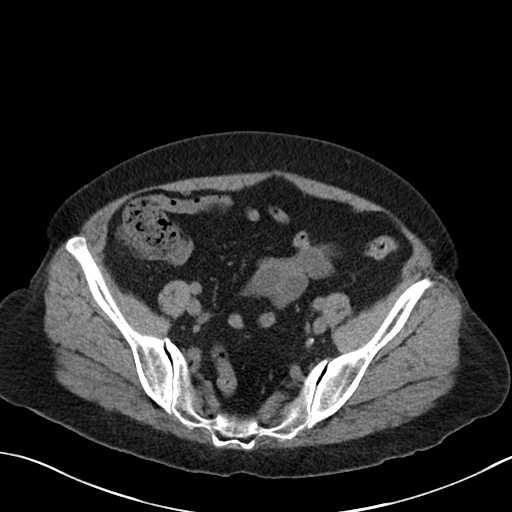
[im 38/91  soft-tissue]
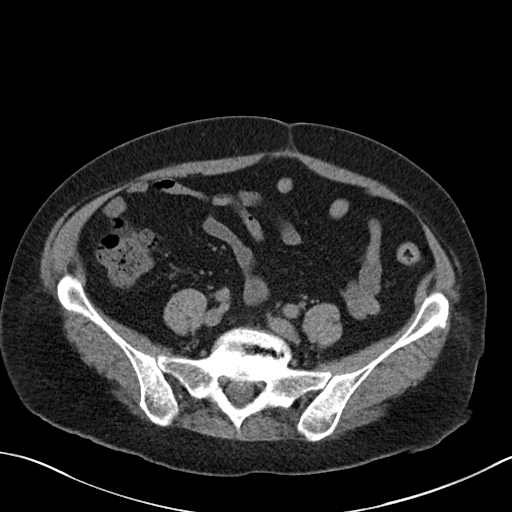
[im 46/91  soft-tissue]
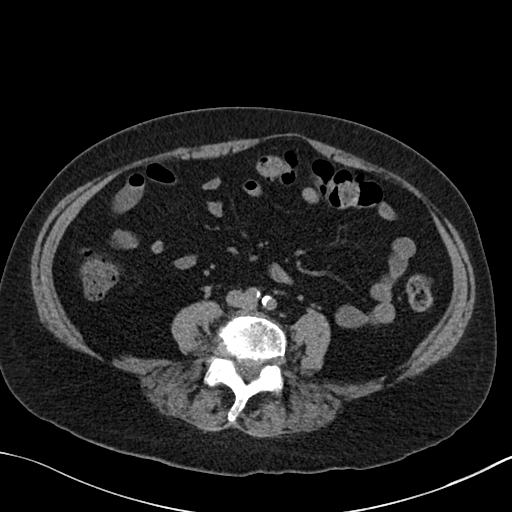
[im 53/91  soft-tissue]
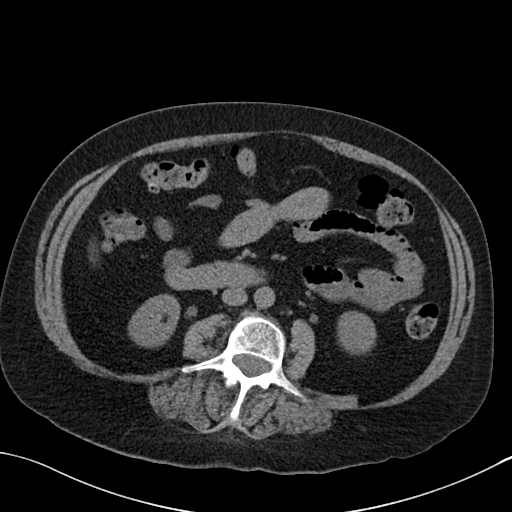
[im 61/91  soft-tissue]
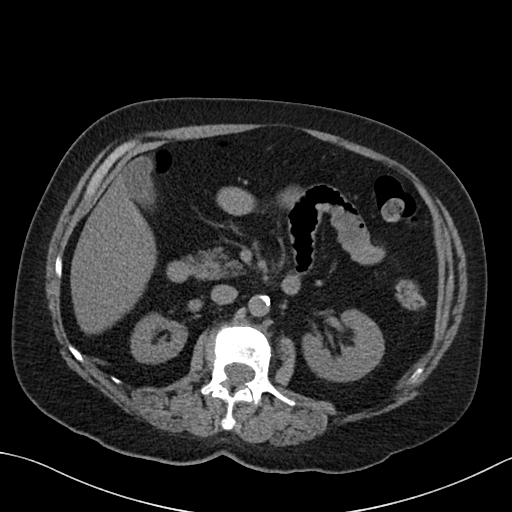
[im 61/91  bone]
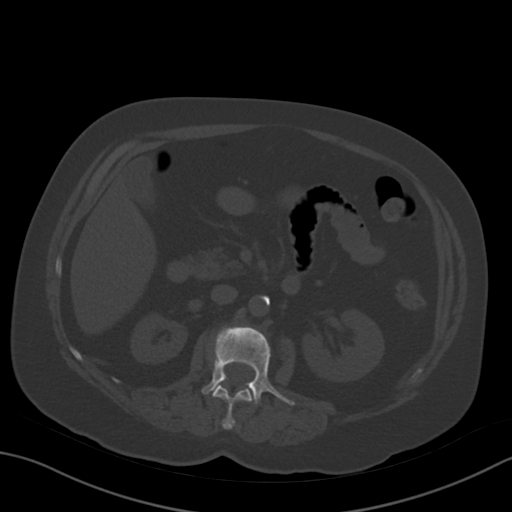
[im 64/91  soft-tissue]
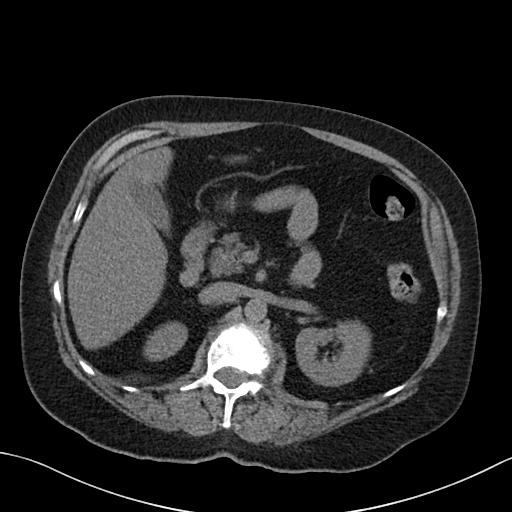
[im 72/91  soft-tissue]
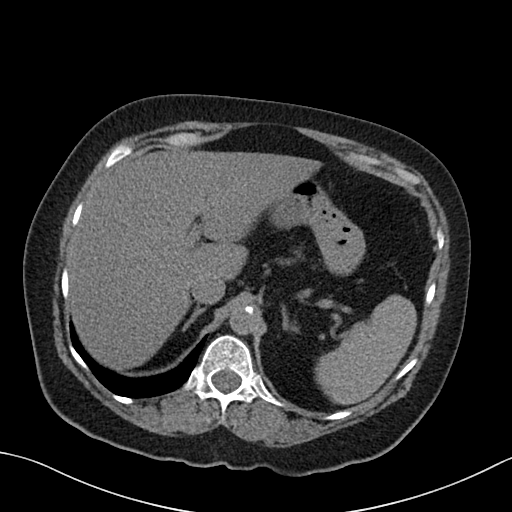
[im 79/91  soft-tissue]
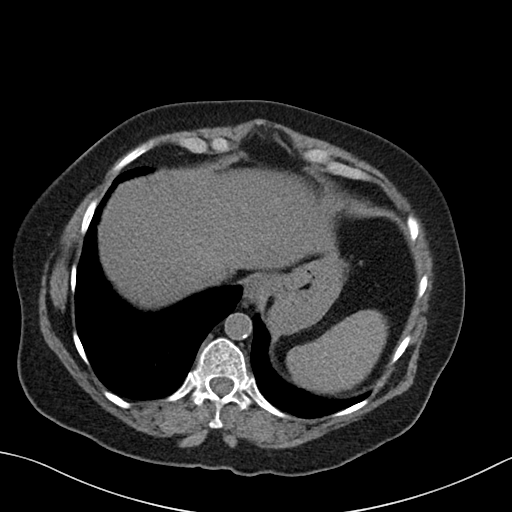
[im 87/91  soft-tissue]
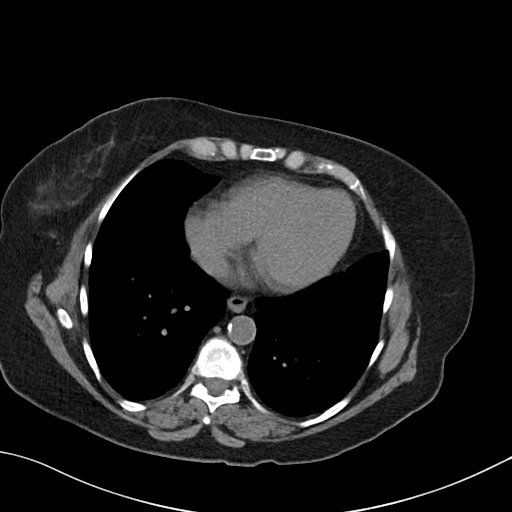

[Series 5: renal stone 3.0 cor · coronal · 0.69mm/px · 3 of 101 slices shown]
[im 34/101  soft-tissue]
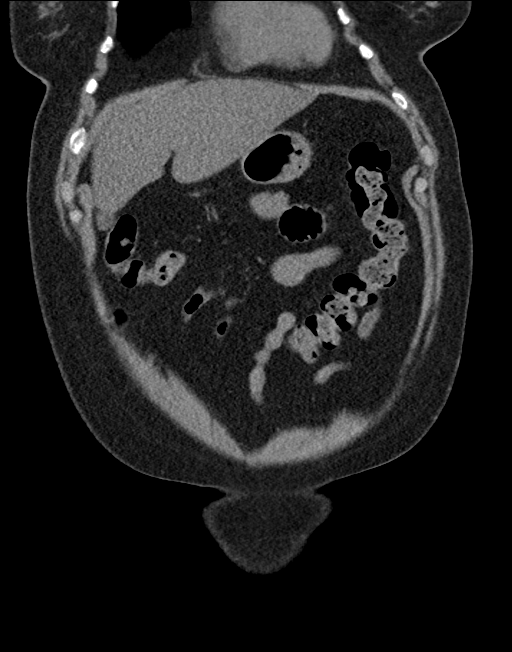
[im 45/101  soft-tissue]
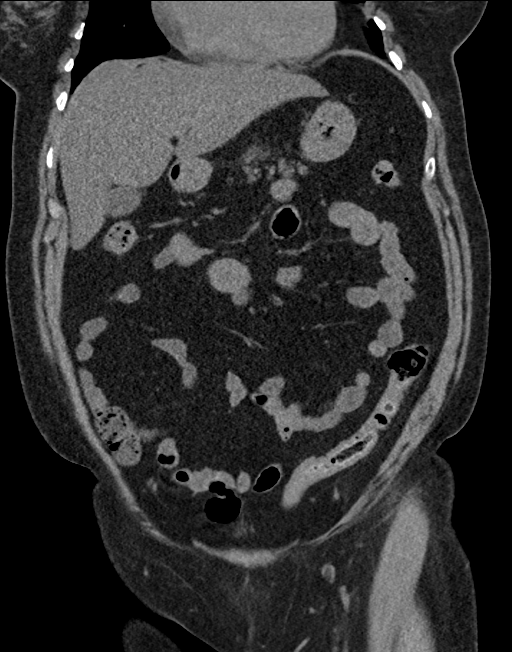
[im 56/101  soft-tissue]
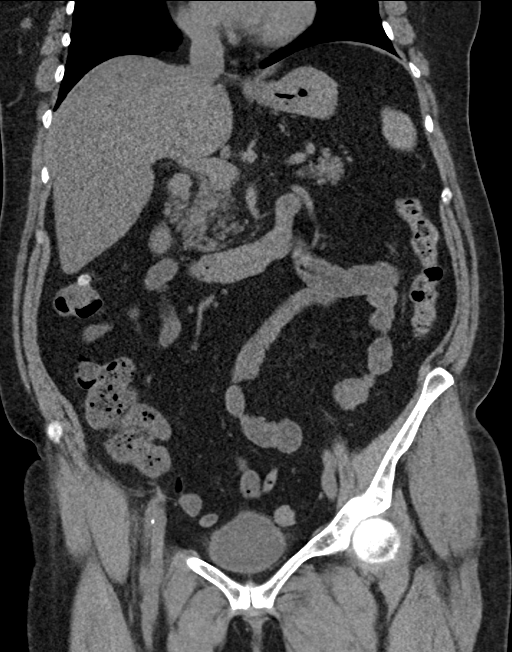

[16 of 46 positions shown; findings below may reference images not displayed]

FINDINGS: Lower chest: Lung bases are free of acute infiltrate or sizable
effusion.

Hepatobiliary: No focal liver abnormality is seen. No gallstones,
gallbladder wall thickening, or biliary dilatation.

Pancreas: Unremarkable. No pancreatic ductal dilatation or
surrounding inflammatory changes.

Spleen: Normal in size without focal abnormality.

Adrenals/Urinary Tract: The adrenal glands are within normal limits.
The kidneys show no renal calculi. Mild fullness of the proximal
ureters is noted bilaterally although normal tapering is seen
distally. The bladder is well distended.

Stomach/Bowel: Minimal diverticular changes noted. No findings of
diverticulitis are seen. No obstructive or inflammatory changes are
noted. The appendix is not well seen. No inflammatory changes are
noted.

Vascular/Lymphatic: Aortic atherosclerosis. No enlarged abdominal or
pelvic lymph nodes.

Reproductive: Status post hysterectomy. No adnexal masses.

Other: No abdominal wall hernia or abnormality. No abdominopelvic
ascites.

Musculoskeletal: Degenerative changes of the lumbar spine are noted.
IMPRESSION: No renal calculi or obstructive changes are seen.

Diverticulosis without diverticulitis.

No acute abnormality is seen.

## 2018-08-30 IMAGING — CR DG CHEST 2V
2 series · 2 of 2 positions shown · non-contrast
Comparison: Chest radiograph 7708

CLINICAL DATA: Productive cough and sore throat

EXAM:
CHEST  2 VIEW

[chest pa]
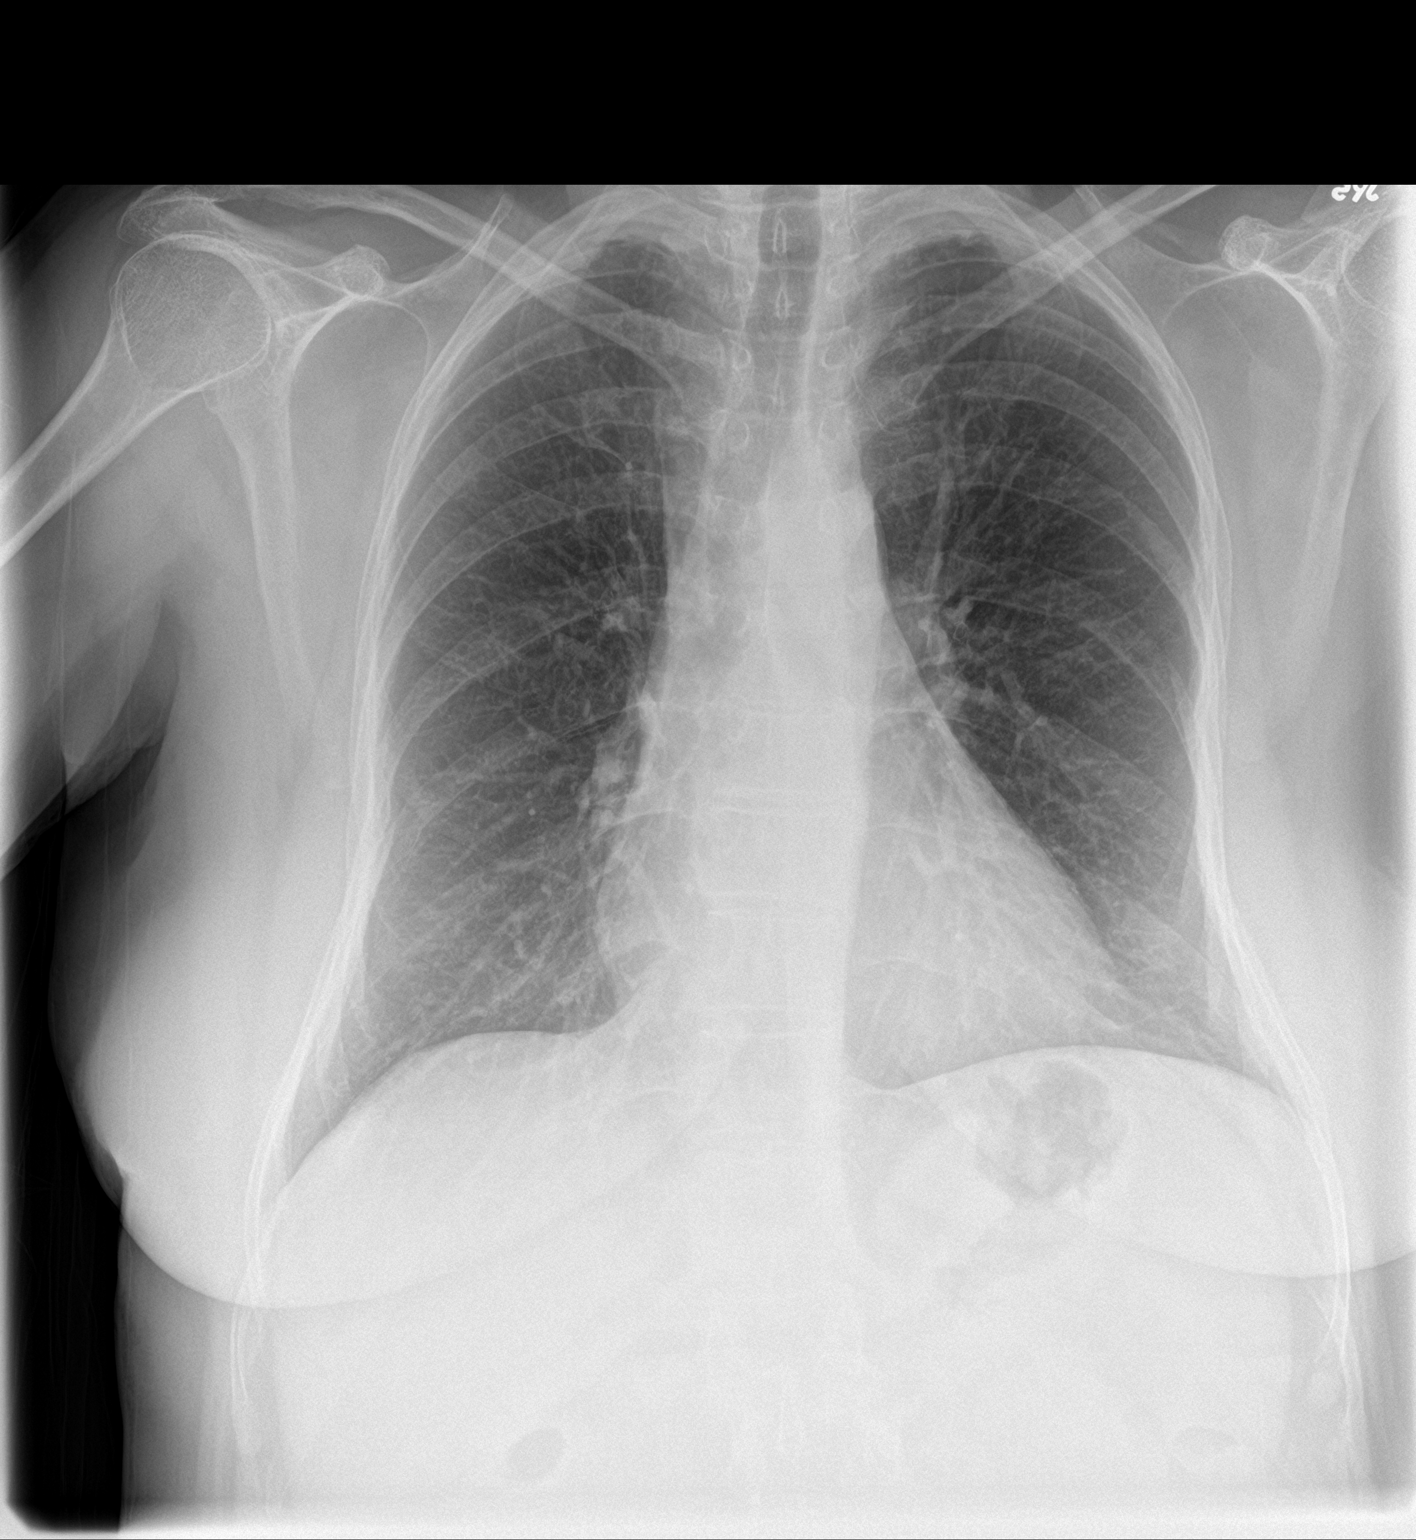

[chest lat]
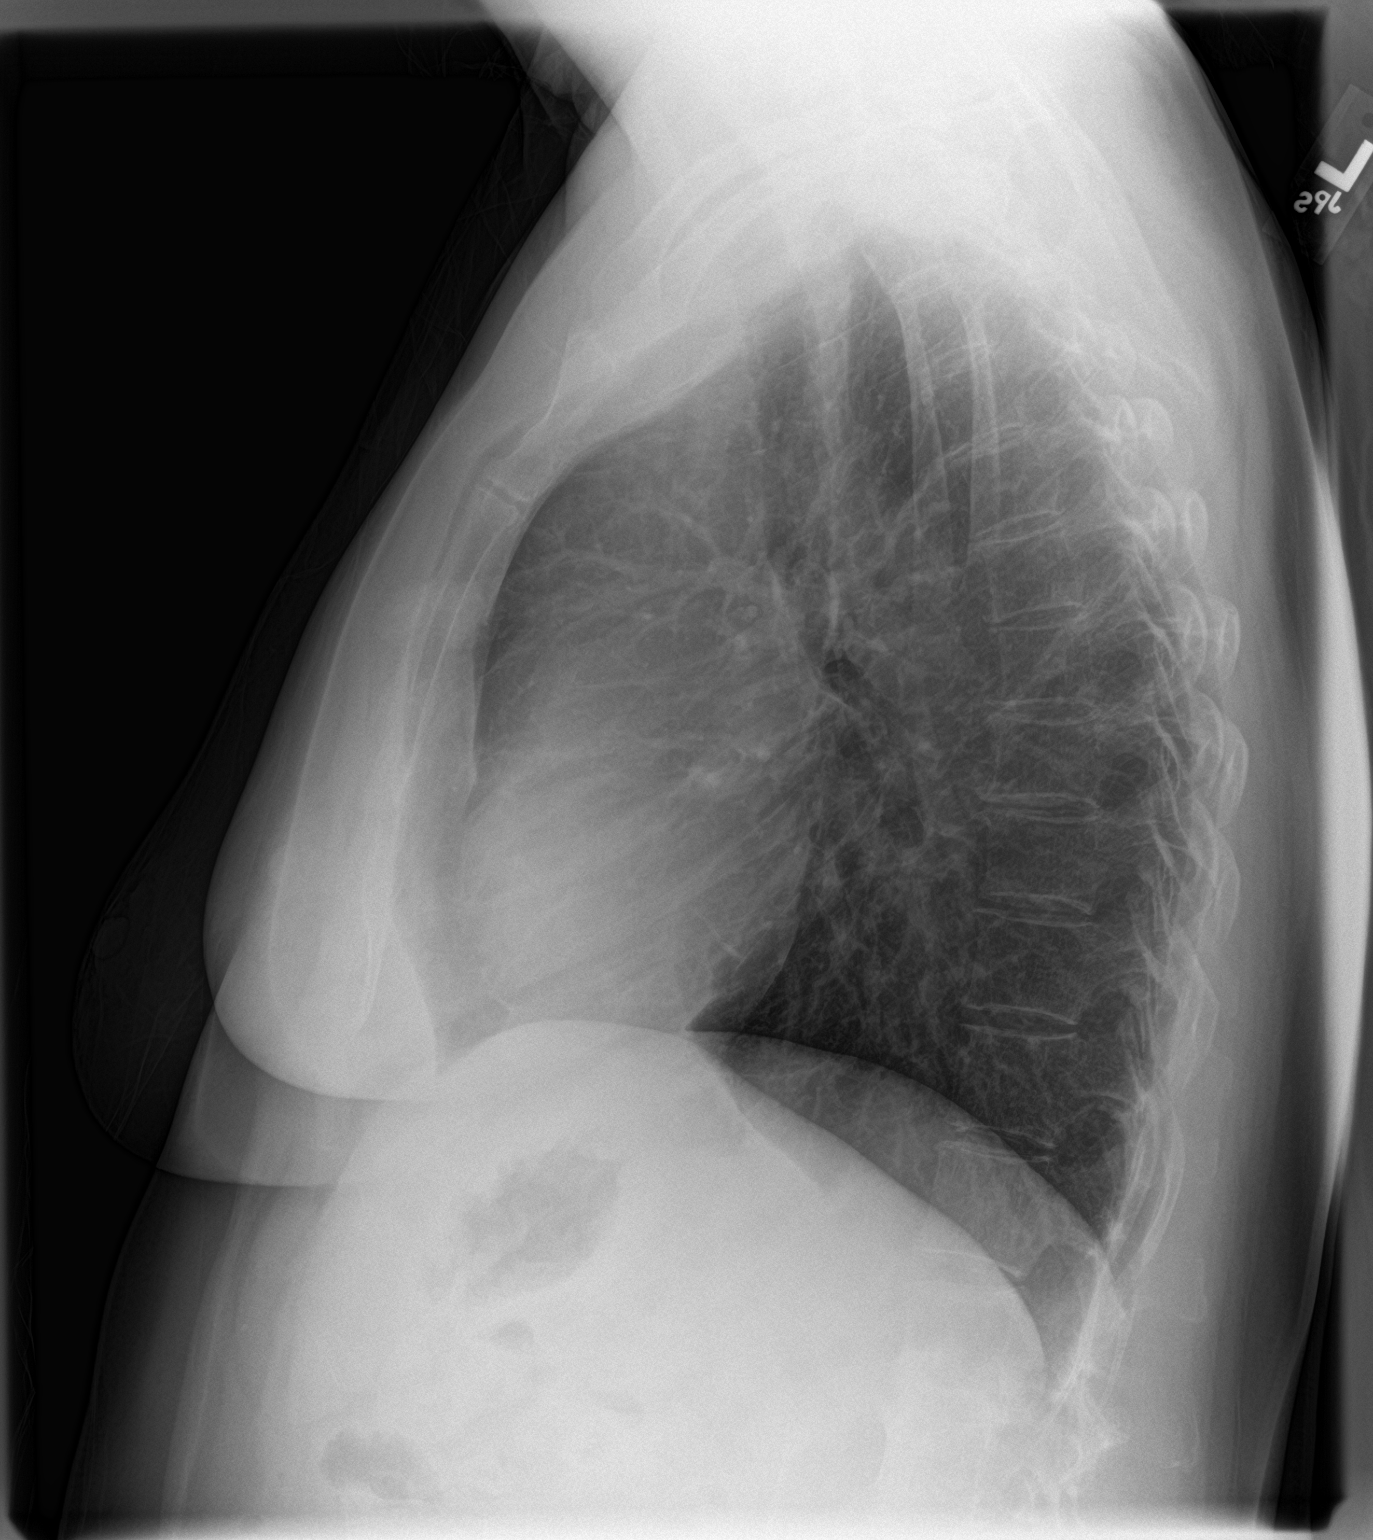

[2 of 2 positions shown; findings below may reference images not displayed]

FINDINGS: Normal mediastinum and cardiac silhouette. Normal pulmonary
vasculature. No evidence of effusion, infiltrate, or pneumothorax.
No acute bony abnormality.
IMPRESSION: No acute cardiopulmonary process.
